# Patient Record
Sex: Female | Born: 1990 | Race: White | Hispanic: No | Marital: Single | Smoking: Never smoker
Health system: Southern US, Community
[De-identification: ages and names within clinical notes are randomized; demographics above are authoritative.]

## PROBLEM LIST (undated history)

## (undated) DIAGNOSIS — J45909 Unspecified asthma, uncomplicated: Secondary | ICD-10-CM

## (undated) HISTORY — PX: WISDOM TOOTH EXTRACTION: SHX21

## (undated) HISTORY — PX: NASAL SEPTUM SURGERY: SHX37

---

## 2011-08-17 ENCOUNTER — Emergency Department (HOSPITAL_COMMUNITY): Payer: BC Managed Care – PPO

## 2011-08-17 ENCOUNTER — Encounter (HOSPITAL_COMMUNITY): Payer: Self-pay | Admitting: *Deleted

## 2011-08-17 ENCOUNTER — Emergency Department (HOSPITAL_COMMUNITY)
Admission: EM | Admit: 2011-08-17 | Discharge: 2011-08-17 | Disposition: A | Discharge status: Home / Self Care | Payer: BC Managed Care – PPO | Attending: Emergency Medicine | Admitting: Emergency Medicine

## 2011-08-17 DIAGNOSIS — N12 Tubulo-interstitial nephritis, not specified as acute or chronic: Secondary | ICD-10-CM | POA: Insufficient documentation

## 2011-08-17 DIAGNOSIS — R109 Unspecified abdominal pain: Secondary | ICD-10-CM | POA: Insufficient documentation

## 2011-08-17 DIAGNOSIS — R112 Nausea with vomiting, unspecified: Secondary | ICD-10-CM | POA: Insufficient documentation

## 2011-08-17 DIAGNOSIS — R000 Tachycardia, unspecified: Secondary | ICD-10-CM | POA: Insufficient documentation

## 2011-08-17 DIAGNOSIS — N898 Other specified noninflammatory disorders of vagina: Secondary | ICD-10-CM | POA: Insufficient documentation

## 2011-08-17 DIAGNOSIS — R10814 Left lower quadrant abdominal tenderness: Secondary | ICD-10-CM | POA: Insufficient documentation

## 2011-08-17 LAB — CBC
HCT: 35.1 % — ABNORMAL LOW (ref 36.0–46.0)
Hemoglobin: 12.2 g/dL (ref 12.0–15.0)
MCH: 31 pg (ref 26.0–34.0)
MCHC: 34.8 g/dL (ref 30.0–36.0)
MCV: 89.1 fL (ref 78.0–100.0)
Platelets: 112 10*3/uL — ABNORMAL LOW (ref 150–400)

## 2011-08-17 LAB — URINALYSIS, ROUTINE W REFLEX MICROSCOPIC
Nitrite: NEGATIVE
Specific Gravity, Urine: 1.017 (ref 1.005–1.030)
Urobilinogen, UA: 0.2 mg/dL (ref 0.0–1.0)
pH: 5.5 (ref 5.0–8.0)

## 2011-08-17 LAB — DIFFERENTIAL
Basophils Absolute: 0 10*3/uL (ref 0.0–0.1)
Lymphocytes Relative: 6 % — ABNORMAL LOW (ref 12–46)
Lymphs Abs: 0.5 10*3/uL — ABNORMAL LOW (ref 0.7–4.0)
Neutro Abs: 7.4 10*3/uL (ref 1.7–7.7)
Neutrophils Relative %: 88 % — ABNORMAL HIGH (ref 43–77)

## 2011-08-17 LAB — POCT I-STAT, CHEM 8
Calcium, Ion: 1.23 mmol/L (ref 1.12–1.32)
Creatinine, Ser: 0.7 mg/dL (ref 0.50–1.10)
Glucose, Bld: 105 mg/dL — ABNORMAL HIGH (ref 70–99)
HCT: 36 % (ref 36.0–46.0)
Hemoglobin: 12.2 g/dL (ref 12.0–15.0)
Potassium: 3.5 mEq/L (ref 3.5–5.1)

## 2011-08-17 LAB — URINE MICROSCOPIC-ADD ON

## 2011-08-17 LAB — PREGNANCY, URINE: Preg Test, Ur: NEGATIVE

## 2011-08-17 LAB — WET PREP, GENITAL: Trich, Wet Prep: NONE SEEN

## 2011-08-17 MED ORDER — FENTANYL CITRATE 0.05 MG/ML IJ SOLN
50.0000 ug | Freq: Once | INTRAMUSCULAR | Status: AC
  Administered 2011-08-17: 50 ug via INTRAVENOUS
  Filled 2011-08-17: qty 2

## 2011-08-17 MED ORDER — ONDANSETRON HCL 4 MG/2ML IJ SOLN
4.0000 mg | Freq: Once | INTRAMUSCULAR | Status: AC
  Administered 2011-08-17: 4 mg via INTRAVENOUS
  Filled 2011-08-17: qty 2

## 2011-08-17 MED ORDER — IOHEXOL 300 MG/ML  SOLN
20.0000 mL | INTRAMUSCULAR | Status: AC
  Administered 2011-08-17: 20 mL via ORAL

## 2011-08-17 MED ORDER — SODIUM CHLORIDE 0.9 % IV BOLUS (SEPSIS)
1000.0000 mL | Freq: Once | INTRAVENOUS | Status: AC
  Administered 2011-08-17: 1000 mL via INTRAVENOUS

## 2011-08-17 MED ORDER — ONDANSETRON 8 MG PO TBDP: ORAL_TABLET | ORAL | Status: AC

## 2011-08-17 MED ORDER — CIPROFLOXACIN HCL 500 MG PO TABS
500.0000 mg | ORAL_TABLET | Freq: Once | ORAL | Status: AC
  Administered 2011-08-17: 500 mg via ORAL
  Filled 2011-08-17: qty 1

## 2011-08-17 MED ORDER — IOHEXOL 300 MG/ML  SOLN
80.0000 mL | Freq: Once | INTRAMUSCULAR | Status: AC | PRN
  Administered 2011-08-17: 80 mL via INTRAVENOUS

## 2011-08-17 MED ORDER — CIPROFLOXACIN HCL 500 MG PO TABS: 500.0000 mg | ORAL_TABLET | Freq: Two times a day (BID) | ORAL | Status: AC

## 2011-08-17 MED ORDER — OXYCODONE-ACETAMINOPHEN 5-325 MG PO TABS: 1.0000 | ORAL_TABLET | ORAL | Status: AC | PRN

## 2011-08-17 MED ORDER — FENTANYL CITRATE 0.05 MG/ML IJ SOLN
50.0000 ug | Freq: Once | INTRAMUSCULAR | Status: AC
  Administered 2011-08-17: 50 ug via INTRAVENOUS

## 2011-08-17 NOTE — ED Notes (Signed)
Pt states that she had a sudden sharp pain in her lower abdomen and pelvic area the pain went to her head and then to her feet then remained in her pelvic area. Pt has back pain as well. Pt admits to protective sex.

## 2011-08-17 NOTE — Discharge Instructions (Signed)
Pyelonephritis, Adult Pyelonephritis is a kidney infection. A kidney infection can happen quickly, or it can last for a long time. HOME CARE   Take your medicine (antibiotics) as told. Finish it even if you start to feel better.   Keep all doctor visits as told.   Drink enough fluids to keep your pee (urine) clear or pale yellow.   Only take medicine as told by your doctor.  GET HELP RIGHT AWAY IF:   You have a fever.   You cannot take your medicine or drink fluids as told.   You have chills and shaking.   You feel very weak or pass out (faint).   You do not feel better after 2 days.  MAKE SURE YOU:  Understand these instructions.   Will watch your condition.   Will get help right away if you are not doing well or get worse.  Document Released: 04/02/2004 Document Revised: 02/12/2011 Document Reviewed: 08/13/2010 Exit[employer] Patient Information 2012 [employer]. 

## 2011-08-17 NOTE — ED Provider Notes (Signed)
History     CSN: [id number]  Arrival date & time 08/17/11  0104   First MD Initiated Contact with Patient 08/17/11 0226      Chief Complaint  Patient presents with  . Abdominal Pain    (Consider location/radiation/quality/duration/timing/severity/associated sxs/prior treatment) Patient is a 21 y.o. female presenting with abdominal pain. The history is provided by the patient. No language interpreter was used.  Abdominal Pain The primary symptoms of the illness include abdominal pain, nausea, vomiting, dysuria and vaginal discharge. The current episode started 3 to 5 hours ago. The onset of the illness was sudden. The problem has been gradually improving.  The abdominal pain is located in the LUQ and LLQ. The abdominal pain does not radiate. The severity of the abdominal pain is 9/10. The abdominal pain is relieved by vomiting. Exacerbated by: nothing.  Vomiting occurs 2 to 5 times per day. The emesis contains stomach contents.  The vaginal discharge was first noticed more than 2 days ago. Vaginal discharge is a chronic problem. The patient believes that the vaginal discharge is unchanged since it began. The amount of discharge is scant. The color of the discharge is white. The discharge is described as homogeneous. The vaginal discharge is associated with dysuria. The vaginal discharge is not associated with itching.  The patient states that she believes she is currently not pregnant. The patient has not had a change in bowel habit. Risk factors: none. Symptoms associated with the illness do not include anorexia. Significant associated medical issues do not include inflammatory bowel disease.  Pain started in LLQ hile in head and then shot through the entire body.  Marked improvement post emesis  History reviewed. No pertinent past medical history.  History reviewed. No pertinent past surgical history.  No family history on file.  History  Substance Use Topics  . Smoking status: Never  Smoker   . Smokeless tobacco: Not on file  . Alcohol Use: Yes    OB History    Grav Para Term Preterm Abortions TAB SAB Ect Mult Living                  Review of Systems  Gastrointestinal: Positive for nausea, vomiting and abdominal pain. Negative for anorexia.  Genitourinary: Positive for dysuria and vaginal discharge.  Skin: Negative for itching.  All other systems reviewed and are negative.    Allergies  Penicillins  Home Medications   Current Outpatient Rx  Name Route Sig Dispense Refill  . BIOTIN PO Oral Take 1 tablet by mouth daily.    . IBUPROFEN 200 MG PO TABS Oral Take 200-600 mg by mouth every 6 (six) hours as needed. As needed for pain.      BP 117/62  Pulse 130  Temp(Src) 98.7 F (37.1 C) (Oral)  Resp 18  SpO2 98%  LMP 07/17/2011  Physical Exam  Constitutional: She is oriented to person, place, and time. She appears well-developed and well-nourished. No distress.  HENT:  Head: Normocephalic and atraumatic.  Eyes: Conjunctivae are normal. Pupils are equal, round, and reactive to light.  Neck: Normal range of motion. Neck supple.  Cardiovascular: Tachycardia present.   Pulmonary/Chest: Effort normal and breath sounds normal. She has no wheezes. She has no rales.  Abdominal: Soft. Bowel sounds are normal. There is tenderness in the left lower quadrant. There is no rebound and no guarding.  Genitourinary: Vaginal discharge found.       B adnexal tenderness chaperone present  Musculoskeletal: Normal range of motion.  Neurological: She is alert and oriented to person, place, and time.  Skin: Skin is warm and dry.  Psychiatric: She has a normal mood and affect.    ED Course  Procedures (including critical care time)  Labs Reviewed  URINALYSIS, ROUTINE W REFLEX MICROSCOPIC - Abnormal; Notable for the following:    APPearance CLOUDY (*)    Hgb urine dipstick SMALL (*)    Leukocytes, UA MODERATE (*)    All other components within normal limits  URINE  MICROSCOPIC-ADD ON - Abnormal; Notable for the following:    Squamous Epithelial / LPF MANY (*)    Bacteria, UA FEW (*)    All other components within normal limits  CBC - Abnormal; Notable for the following:    HCT 35.1 (*)    Platelets 112 (*)    All other components within normal limits  DIFFERENTIAL - Abnormal; Notable for the following:    Neutrophils Relative 88 (*)    Lymphocytes Relative 6 (*)    Lymphs Abs 0.5 (*)    All other components within normal limits  WET PREP, GENITAL - Abnormal; Notable for the following:    Clue Cells Wet Prep HPF POC FEW (*)    WBC, Wet Prep HPF POC FEW (*)    All other components within normal limits  POCT I-STAT, CHEM 8 - Abnormal; Notable for the following:    Glucose, Bld 105 (*)    All other components within normal limits  PREGNANCY, URINE  GC/CHLAMYDIA PROBE AMP, GENITAL   US Transvaginal Non-ob  08/17/2011  *RADIOLOGY REPORT*  Clinical Data: Left-sided pain/abdominal pain.  TRANSABDOMINAL AND TRANSVAGINAL ULTRASOUND OF PELVIS Technique:  Both transabdominal and transvaginal ultrasound examinations of the pelvis were performed. Transabdominal technique was performed for global imaging of the pelvis including uterus, ovaries, adnexal regions, and pelvic cul-de-sac.  Comparison: None.   It was necessary to proceed with endovaginal exam following the transabdominal exam to visualize the endometrium and adnexa.  Findings:  Uterus: Normal in size and appearance, measures 9.0 x 3.4 x 5.6 cm. There is a trace amount of fluid within the lower uterine segment.  Endometrium: Normal in thickness and appearance, measuring 5 mm.  Right ovary:  Normal appearance/no adnexal mass.  Measures 4.7 x 2.3 x 3.0 cm.  Left ovary: Normal appearance/no adnexal mass.  Measures 4.5 x 2.9 x 3.3 cm.  Other findings: No free fluid. Color Doppler flow with arterial and venous wave forms documented to both ovaries.  IMPRESSION: Normal study. No evidence of pelvic mass or other  significant abnormality.  Original Report Authenticated By: Waneta Martins, M.D.   US Pelvis Complete  08/17/2011  *RADIOLOGY REPORT*  Clinical Data: Left-sided pain/abdominal pain.  TRANSABDOMINAL AND TRANSVAGINAL ULTRASOUND OF PELVIS Technique:  Both transabdominal and transvaginal ultrasound examinations of the pelvis were performed. Transabdominal technique was performed for global imaging of the pelvis including uterus, ovaries, adnexal regions, and pelvic cul-de-sac.  Comparison: None.   It was necessary to proceed with endovaginal exam following the transabdominal exam to visualize the endometrium and adnexa.  Findings:  Uterus: Normal in size and appearance, measures 9.0 x 3.4 x 5.6 cm. There is a trace amount of fluid within the lower uterine segment.  Endometrium: Normal in thickness and appearance, measuring 5 mm.  Right ovary:  Normal appearance/no adnexal mass.  Measures 4.7 x 2.3 x 3.0 cm.  Left ovary: Normal appearance/no adnexal mass.  Measures 4.5 x 2.9 x 3.3 cm.  Other findings: No free  fluid. Color Doppler flow with arterial and venous wave forms documented to both ovaries.  IMPRESSION: Normal study. No evidence of pelvic mass or other significant abnormality.  Original Report Authenticated By: Waneta Martins, M.D.   [place] Art/ven Flow Abd Pelv Doppler  08/17/2011  *RADIOLOGY REPORT*  Clinical Data: Left-sided pain/abdominal pain.  TRANSABDOMINAL AND TRANSVAGINAL ULTRASOUND OF PELVIS Technique:  Both transabdominal and transvaginal ultrasound examinations of the pelvis were performed. Transabdominal technique was performed for global imaging of the pelvis including uterus, ovaries, adnexal regions, and pelvic cul-de-sac.  Comparison: None.   It was necessary to proceed with endovaginal exam following the transabdominal exam to visualize the endometrium and adnexa.  Findings:  Uterus: Normal in size and appearance, measures 9.0 x 3.4 x 5.6 cm. There is a trace amount of fluid within the  lower uterine segment.  Endometrium: Normal in thickness and appearance, measuring 5 mm.  Right ovary:  Normal appearance/no adnexal mass.  Measures 4.7 x 2.3 x 3.0 cm.  Left ovary: Normal appearance/no adnexal mass.  Measures 4.5 x 2.9 x 3.3 cm.  Other findings: No free fluid. Color Doppler flow with arterial and venous wave forms documented to both ovaries.  IMPRESSION: Normal study. No evidence of pelvic mass or other significant abnormality.  Original Report Authenticated By: Waneta Martins, M.D.     No diagnosis found.    MDM  Pyelonephritis, patient asking to eat and drink and wants d/c.  Patient to return immediately for fever > 101.4 intractable pain, intractable nausea and vomiting or any concerns.  Recheck with your family doctor in 2 days.  Patient and father verbalize understanding and agree to follow up      Tanicia Wolaver Smitty Cords, MD 08/17/11 [phone]

## 2011-08-17 NOTE — ED Notes (Signed)
Pt c/o abd pain with nv no diarrhea just started tonight with chills.  lmp may 9th

## 2011-08-18 LAB — GC/CHLAMYDIA PROBE AMP, GENITAL
Chlamydia, DNA Probe: NEGATIVE
GC Probe Amp, Genital: NEGATIVE

## 2013-03-31 ENCOUNTER — Emergency Department (HOSPITAL_COMMUNITY)
Admission: EM | Admit: 2013-03-31 | Discharge: 2013-03-31 | Disposition: A | Discharge status: Home / Self Care | Payer: BC Managed Care – PPO | Attending: Emergency Medicine | Admitting: Emergency Medicine

## 2013-03-31 ENCOUNTER — Encounter (HOSPITAL_COMMUNITY): Payer: Self-pay | Admitting: Emergency Medicine

## 2013-03-31 ENCOUNTER — Emergency Department (HOSPITAL_COMMUNITY): Payer: BC Managed Care – PPO

## 2013-03-31 DIAGNOSIS — Y929 Unspecified place or not applicable: Secondary | ICD-10-CM | POA: Insufficient documentation

## 2013-03-31 DIAGNOSIS — J45909 Unspecified asthma, uncomplicated: Secondary | ICD-10-CM | POA: Insufficient documentation

## 2013-03-31 DIAGNOSIS — Z79899 Other long term (current) drug therapy: Secondary | ICD-10-CM | POA: Insufficient documentation

## 2013-03-31 DIAGNOSIS — Z880 Allergy status to penicillin: Secondary | ICD-10-CM | POA: Insufficient documentation

## 2013-03-31 DIAGNOSIS — Y9341 Activity, dancing: Secondary | ICD-10-CM | POA: Insufficient documentation

## 2013-03-31 DIAGNOSIS — S83006A Unspecified dislocation of unspecified patella, initial encounter: Secondary | ICD-10-CM | POA: Insufficient documentation

## 2013-03-31 DIAGNOSIS — X500XXA Overexertion from strenuous movement or load, initial encounter: Secondary | ICD-10-CM | POA: Insufficient documentation

## 2013-03-31 HISTORY — DX: Unspecified asthma, uncomplicated: J45.909

## 2013-03-31 MED ORDER — IBUPROFEN 600 MG PO TABS
600.0000 mg | ORAL_TABLET | Freq: Four times a day (QID) | ORAL | Status: AC | PRN
Start: 2013-03-31 — End: ?

## 2013-03-31 MED ORDER — HYDROCODONE-ACETAMINOPHEN 5-325 MG PO TABS: 1.0000 | ORAL_TABLET | ORAL | Status: AC | PRN

## 2013-03-31 MED ORDER — OXYCODONE-ACETAMINOPHEN 5-325 MG PO TABS
1.0000 | ORAL_TABLET | Freq: Once | ORAL | Status: AC
  Administered 2013-03-31: 1 via ORAL
  Filled 2013-03-31: qty 1

## 2013-03-31 NOTE — Discharge Instructions (Signed)
Patellar Dislocation  A patellar dislocation occurs when your kneecap (patella) slips out of its normal position in a groove in front of the lower end of your thighbone (femur). This groove is called the patellofemoral groove.   CAUSES  The kneecap is normally positioned over the front of the knee joint at the base of the thighbone. A kneecap can be dislocated when:  · The kneecap is out of place (patellar tracking disorder), and force is applied.  · The foot is firmly planted pointing outward, and the knee bends with the thigh turned inward. This kind of injury is common during many sports activities.  · The inner edge of the kneecap is hit, pushing it toward the outer side of the leg.  SIGNS AND SYMPTOMS  · Severe pain.  · A misshapen knee that looks like a bone is out of position.  · A popping sensation, followed by a feeling that something is out of place.  · Inability to bend or straighten the knee.  · Knee swelling.  · Cool, pale skin or numbness and tingling in or below the affected knee.  DIAGNOSIS   Your health care provider will physically examine the injured area. An X-ray exam may be done to make sure a bone fracture has not occurred. In some cases, your health care provider may look inside your knee joint with an instrument much like a pencil-sized telescope (arthroscope). This may be done to make sure you have no loose cartilage in your joint. Loose cartilage is not visible on an X-ray image.  TREATMENT   In many instances, the patella can be guided back into position without much difficulty. It often goes back into position by straightening the leg. Often, nothing more may be needed other than a brief period of immobilization followed by the exercises your health care provider recommends. If patellar dislocation starts to become frequent after the first incident, surgery may be needed to prevent your patella from slipping out of place.  HOME CARE INSTRUCTIONS   · Only take over-the-counter or  prescription medicines for pain, discomfort, or fever as directed by your health care provider.  · Use a knee brace if directed to do so by your health care provider.  · Use crutches as instructed.  · Apply ice to the injured knee:  · Put ice in a plastic bag.  · Place a towel between your skin and the bag.  · Leave the ice on for 20 minutes, 2 3 times a day.  · Follow your health care provider's instructions for doing any recommended range-of-motion exercises or other exercises.  SEEK IMMEDIATE MEDICAL CARE IF:  · You have increased pain or swelling in the knee that is not relieved with medicine.  · You have increasing inflammation in the knee.  · You have locking or catching of your knee.  MAKE SURE YOU:  · Understand these instructions.  · Will watch your condition.  · Will get help right away if you are not doing well or get worse.  Document Released: 11/18/2000 Document Revised: 12/14/2012 Document Reviewed: 10/05/2012  [employer]® Patient Information ©2014 [employer].

## 2013-03-31 NOTE — ED Provider Notes (Signed)
CSN: [id number]     Arrival date & time 03/31/13  0029 History   First MD Initiated Contact with Patient 03/31/13 [phone]     Chief Complaint  Patient presents with  . Knee Injury   (Consider location/radiation/quality/duration/timing/severity/associated sxs/prior Treatment) HPI Patient states she was dancing when her last "knee cap" pop out of place. She fell immediately to the ground. She states he then reduced when she tried to straighten her leg. She continues to have pain at the knee and inability to bend it completely. She has no numbness or tingling in her lower extremity. She has no weakness Past Medical History  Diagnosis Date  . Asthma    Past Surgical History  Procedure Laterality Date  . Nasal septum surgery    . Wisdom tooth extraction     No family history on file. History  Substance Use Topics  . Smoking status: Never Smoker   . Smokeless tobacco: Not on file  . Alcohol Use: Yes     Comment: occasionally    OB History   Grav Para Term Preterm Abortions TAB SAB Ect Mult Living                 Review of Systems  Cardiovascular: Negative for leg swelling.  Musculoskeletal: Positive for arthralgias. Negative for joint swelling.  Neurological: Negative for syncope, weakness and numbness.    Allergies  Penicillins  Home Medications   Current Outpatient Rx  Name  Route  Sig  Dispense  Refill  . albuterol (PROVENTIL HFA;VENTOLIN HFA) 108 (90 BASE) MCG/ACT inhaler   Inhalation   Inhale 1 puff into the lungs every 6 (six) hours as needed for wheezing or shortness of breath.         Marland Kitchen BIOTIN PO   Oral   Take 1 tablet by mouth daily.         Marland Kitchen etonogestrel-ethinyl estradiol (NUVARING) 0.12-0.015 MG/24HR vaginal ring   Vaginal   Place 1 each vaginally every 28 (twenty-eight) days. Insert vaginally and leave in place for 3 consecutive weeks, then remove for 1 week.         Marland Kitchen ibuprofen (ADVIL,MOTRIN) 200 MG tablet   Oral   Take 200-600 mg by mouth every 6  (six) hours as needed. As needed for pain.          BP 119/57  Pulse 89  Temp(Src) 98.9 F (37.2 C) (Oral)  Resp 18  Ht 5\' 7"  (1.702 m)  Wt 125 lb (56.7 kg)  BMI 19.57 kg/m2  SpO2 100%  LMP 03/21/2013 Physical Exam  Nursing note and vitals reviewed. Constitutional: She is oriented to person, place, and time. She appears well-developed and well-nourished. No distress.  HENT:  Head: Normocephalic and atraumatic.  Mouth/Throat: Oropharynx is clear and moist.  Eyes: EOM are normal. Pupils are equal, round, and reactive to light.  Neck: Normal range of motion. Neck supple.  Cardiovascular: Normal rate and regular rhythm.   Pulmonary/Chest: Effort normal and breath sounds normal.  Abdominal: Soft. Bowel sounds are normal.  Musculoskeletal: Normal range of motion. She exhibits no edema and no tenderness.  The left patella is normally located. She has no tenderness to palpation over the patella. There is no obvious joint effusion. Patient has limited range of motion due to pain. No obvious ligamentous instability. She has no popliteal swelling. She has 2+ dorsalis pedis pulses.  Neurological: She is alert and oriented to person, place, and time.  5/5 motor in all extremities. Sensation intact.  Skin: Skin is warm and dry. No rash noted. No erythema.  Psychiatric: She has a normal mood and affect. Her behavior is normal.    ED Course  Procedures (including critical care time) Labs Review Labs Reviewed - No data to display Imaging Review Dg Knee Complete 4 Views Left  03/31/2013   CLINICAL DATA:  Knee injury  EXAM: LEFT KNEE - COMPLETE 4+ VIEW  COMPARISON:  None.  FINDINGS: No acute fracture or dislocation identified. No joint effusion. A 1.4 cm lucent lesion with sclerotic margin is noted within the medial aspect of the distal femoral metaphysis, of doubtful clinical significance. No associated soft tissue mass. No other focal osseous lesions identified. Osseous mineralization is  normal. No radiopaque foreign body.  IMPRESSION: 1. No acute fracture or dislocation. 2. Well-circumscribed lucent lesion with sclerotic margins within the distal femoral metaphysis. 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 No further follow-up recommended in regards of this finding.   Electronically Signed   By: Rise MuBenjamin  McClintock M.D.   On: 03/31/2013 01:52    EKG Interpretation   None       MDM  Suspect patella dislocation/reduction. We'll place in a straight leg brace and have her followup with orthopedics. Return precautions given    Loren Raceravid Tannie Koskela, MD 03/31/13 [phone]

## 2013-03-31 NOTE — ED Notes (Signed)
Pt presents with c/o left knee injury. Pt says her left knee popped out of place while she was dancing.

## 2014-12-28 IMAGING — CR DG KNEE COMPLETE 4+V*L*
4 series · 4 of 4 positions shown · non-contrast
Comparison: None.

CLINICAL DATA: Knee injury

EXAM:
LEFT KNEE - COMPLETE 4+ VIEW

[t knee ap left]
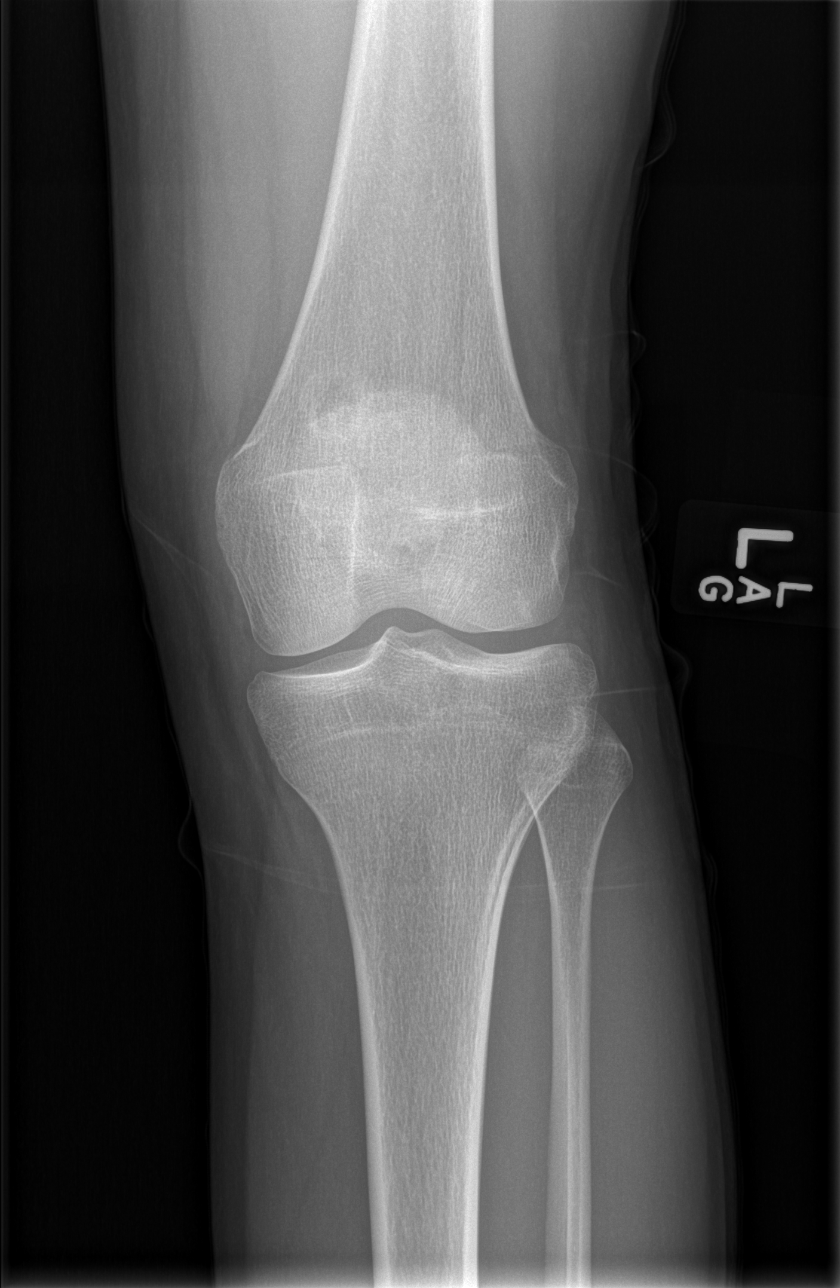

[t knee obl left (1 of 2)]
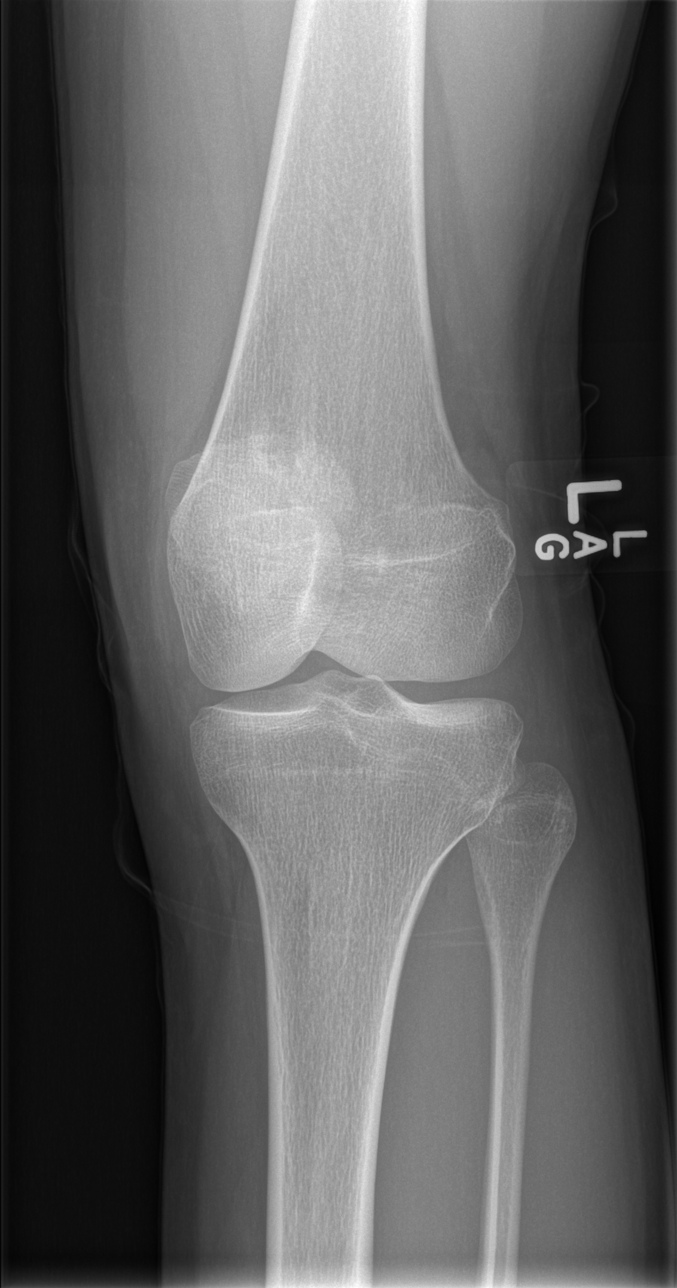

[t knee obl left (2 of 2)]
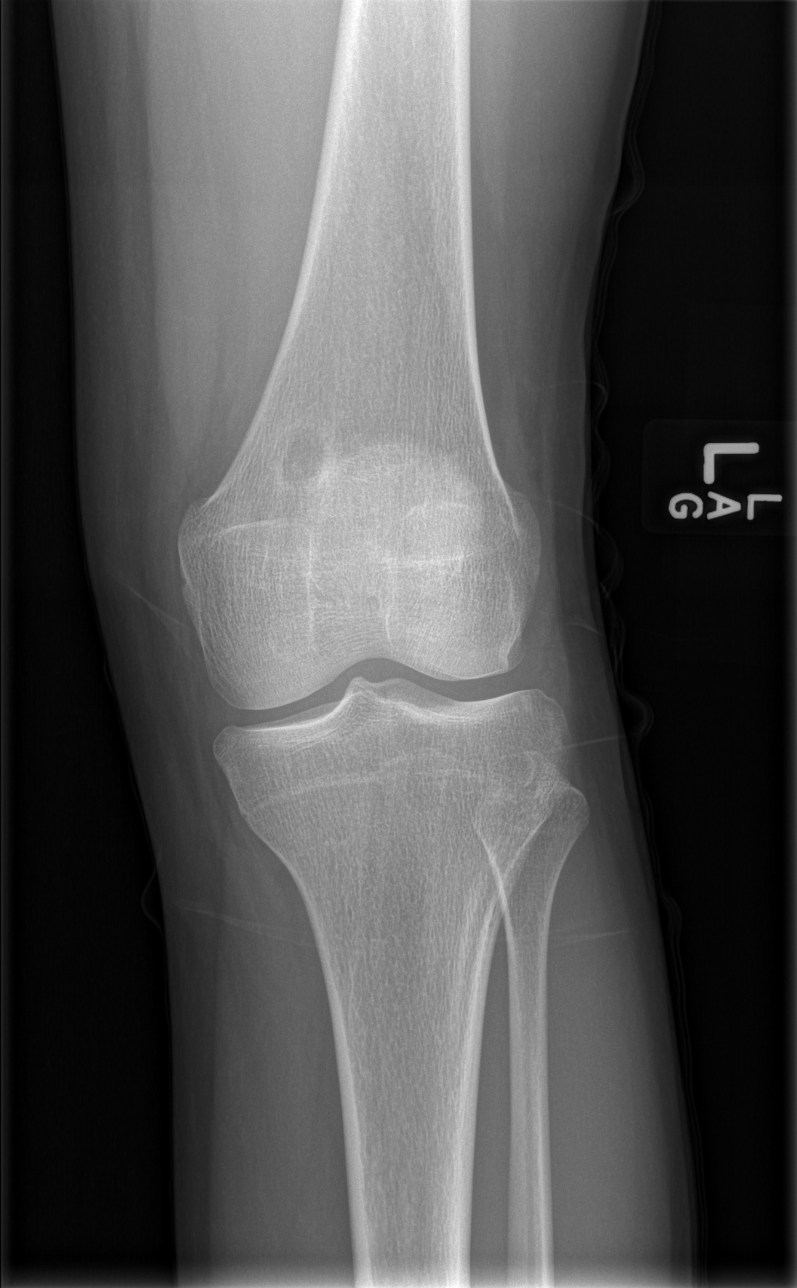

[t knee lat left]
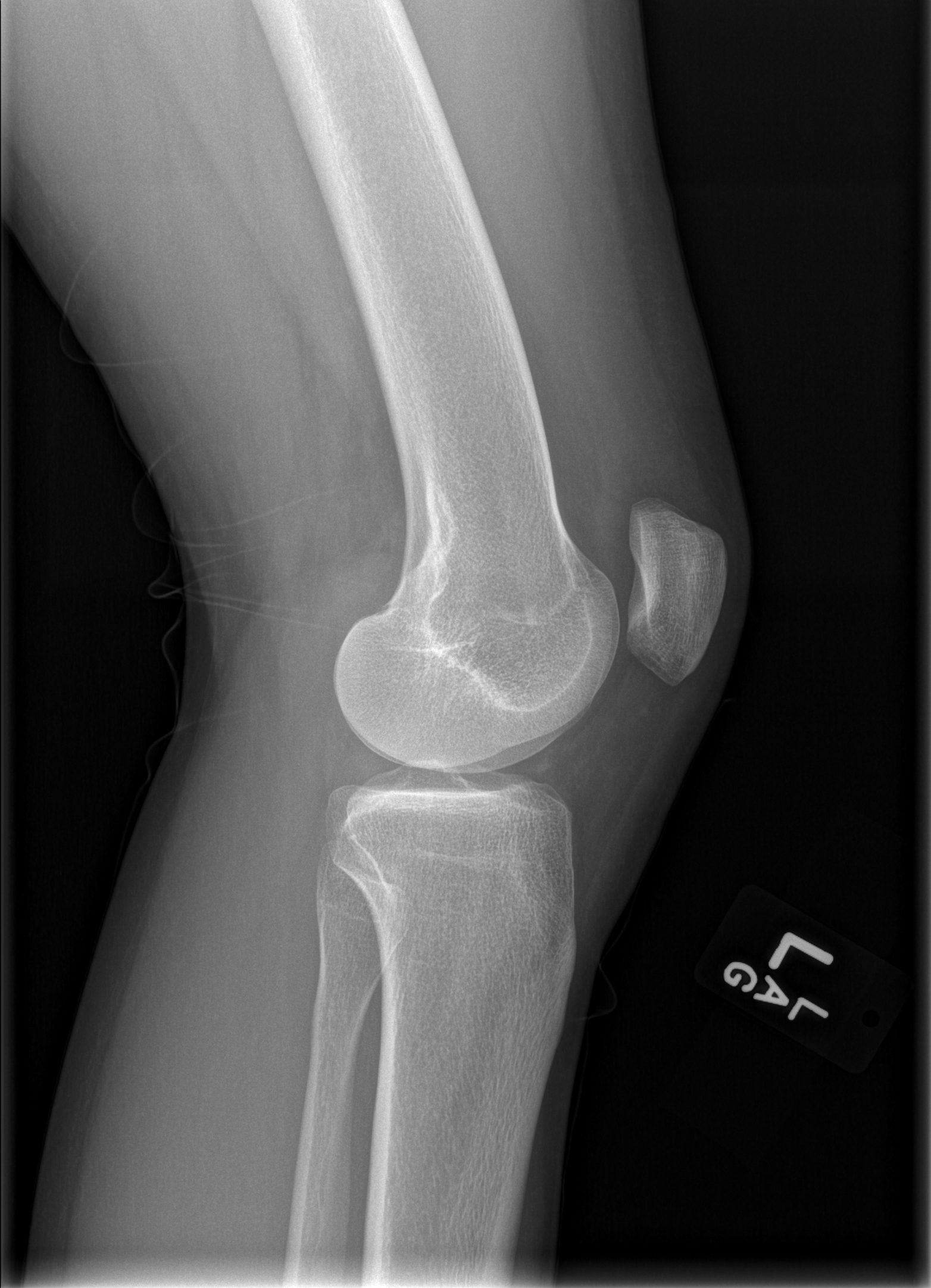

[4 of 4 positions shown; findings below may reference images not displayed]

FINDINGS: No acute fracture or dislocation identified. No joint effusion. A
1.4 cm lucent lesion with sclerotic margin is noted within the
medial aspect of the distal femoral metaphysis, of doubtful clinical
significance. No associated soft tissue mass. No other focal osseous
lesions identified. Osseous mineralization is normal. No radiopaque
foreign body.
IMPRESSION: 1. No acute fracture or dislocation.
2. Well-circumscribed lucent lesion with sclerotic margins within
the distal femoral metaphysis. Finding is most consistent with
probable avulsive cortical irregularity/ cortical desmoid related to
repetitive stress. No further follow-up recommended in regards of
this finding.
